# Patient Record
Sex: Male | Born: 1980 | Marital: Single | Smoking: Never smoker
Health system: Southern US, Community
[De-identification: ages and names within clinical notes are randomized; demographics above are authoritative.]

## PROBLEM LIST (undated history)

## (undated) DIAGNOSIS — K649 Unspecified hemorrhoids: Secondary | ICD-10-CM

## (undated) DIAGNOSIS — F79 Unspecified intellectual disabilities: Secondary | ICD-10-CM

## (undated) DIAGNOSIS — Z87898 Personal history of other specified conditions: Secondary | ICD-10-CM

## (undated) HISTORY — PX: TUMOR REMOVAL: SHX12

## (undated) HISTORY — DX: Personal history of other specified conditions: Z87.898

## (undated) HISTORY — DX: Unspecified hemorrhoids: K64.9

## (undated) HISTORY — DX: Unspecified intellectual disabilities: F79

---

## 2017-03-16 ENCOUNTER — Ambulatory Visit (INDEPENDENT_AMBULATORY_CARE_PROVIDER_SITE_OTHER): Payer: Medicare Other | Admitting: Family Medicine

## 2017-03-16 ENCOUNTER — Encounter: Payer: Self-pay | Admitting: Family Medicine

## 2017-03-16 ENCOUNTER — Encounter (INDEPENDENT_AMBULATORY_CARE_PROVIDER_SITE_OTHER): Payer: Self-pay

## 2017-03-16 VITALS — BP 124/86 | HR 76 | Temp 98.9°F | Ht 71.0 in | Wt 128.0 lb

## 2017-03-16 DIAGNOSIS — R634 Abnormal weight loss: Secondary | ICD-10-CM

## 2017-03-16 LAB — CBC WITH DIFFERENTIAL/PLATELET
Basophils Absolute: 0 10*3/uL (ref 0.0–0.1)
Basophils Relative: 1.1 % (ref 0.0–3.0)
Eosinophils Absolute: 0 10*3/uL (ref 0.0–0.7)
Eosinophils Relative: 1 % (ref 0.0–5.0)
HEMATOCRIT: 46.2 % (ref 39.0–52.0)
Hemoglobin: 15.4 g/dL (ref 13.0–17.0)
LYMPHS PCT: 36 % (ref 12.0–46.0)
Lymphs Abs: 1.1 10*3/uL (ref 0.7–4.0)
MCHC: 33.4 g/dL (ref 30.0–36.0)
MCV: 87.7 fl (ref 78.0–100.0)
MONOS PCT: 9.3 % (ref 3.0–12.0)
Monocytes Absolute: 0.3 10*3/uL (ref 0.1–1.0)
Neutro Abs: 1.6 10*3/uL (ref 1.4–7.7)
Neutrophils Relative %: 52.6 % (ref 43.0–77.0)
Platelets: 278 10*3/uL (ref 150.0–400.0)
RBC: 5.26 Mil/uL (ref 4.22–5.81)
RDW: 13.9 % (ref 11.5–15.5)
WBC: 3 10*3/uL — AB (ref 4.0–10.5)

## 2017-03-16 LAB — TSH: TSH: 0.68 u[IU]/mL (ref 0.35–4.50)

## 2017-03-16 LAB — T3, FREE: T3 FREE: 3.4 pg/mL (ref 2.3–4.2)

## 2017-03-16 LAB — T4, FREE: FREE T4: 0.93 ng/dL (ref 0.60–1.60)

## 2017-03-16 LAB — PSA: PSA: 0.81 ng/mL (ref 0.10–4.00)

## 2017-03-16 NOTE — Patient Instructions (Signed)
We will call with the results and go from there.  Take care  Dr. Bradi Arbuthnot  

## 2017-03-16 NOTE — Progress Notes (Signed)
Subjective:  Patient ID: Andrew Berger, male    DOB: Aug 24, 1981  Age: 36 y.o. MRN: [id number]  CC: Weight loss  HPI Andrew Berger is a 36 y.o. male presents to the clinic today for evaluation of the above.  Patient is mentally challenged, therefore the history is obtained primarily from the mother.  Mother notes that he has lost 20 pounds in the past month. Unintentional weight loss. She states that he has normal appetite. He has had recent abdominal pain and rectal bleeding. He was seen at a local urgent care and was treated for hemorrhoids. He was also seen in [facility] after developing abdominal pain in addition to his rectal bleeding. In the ER he had a negative CT of the abdomen. Labs revealed leukopenia and a suppressed TSH. Normal free T4. He was discharged home and instructed to follow-up with primary care.  Patient's weight in the ER was 129 pounds. He weighs 128 pounds today. Mother states that he continues to eat well. She does note that he has some difficulty with urinary frequency. She states that while being seen at urgent care and the emergency room he had an examination the prostate which was found to be firm and nodular. No PSA is available. He does have a family history of prostate cancer. His rectal bleeding has resolved. He's had no further abdominal pain, diarrhea. She states that he deals with constipation intermittently. Has improved with stool softener. Patient essentially has no complaints today other than ongoing weight loss. No reports of fevers, night sweats. Mother does state that he has some dark lesions on his legs which are concerning for her.   PMH, Surgical Hx, Family Hx, Social History reviewed and updated as below.  Past Medical History:  Diagnosis Date  . Hemorrhoids   . History of seizure   . Mental retardation    Past Surgical History:  Procedure Laterality Date  . TUMOR REMOVAL     ? Meningioma   Family History  Problem  Relation Age of Onset  . Hypertension Mother   . Diabetes Mother   . Colon cancer Father   . Prostate cancer Father   . Hyperlipidemia Father   . Hypertension Father   . Kidney disease Father   . Stroke Maternal Grandmother   . Prostate cancer Paternal Grandfather   . Hypertension Paternal Grandfather    Social History  Substance Use Topics  . Smoking status: Never Smoker  . Smokeless tobacco: Never Used  . Alcohol use Not on file    Review of Systems  Constitutional: Positive for unexpected weight change.  Gastrointestinal: Positive for abdominal pain, constipation and diarrhea.  Genitourinary: Positive for difficulty urinating and frequency.  Skin:       Hair loss, scalp itching  All other systems reviewed and are negative.   Objective:   Today's Vitals: BP 124/86 (BP Location: Right Arm, Patient Position: Sitting, Cuff Size: Normal)   Pulse 76   Temp 98.9 F (37.2 C) (Oral)   Ht 5\' 11"  (1.803 m)   Wt 128 lb (58.1 kg)   SpO2 96%   BMI 17.85 kg/m   Physical Exam  Constitutional: He is oriented to person, place, and time.  Thin male, well appearing, NAD.  HENT:  Head: Normocephalic and atraumatic.  Mouth/Throat: Oropharynx is clear and moist.  Eyes: Conjunctivae are normal. No scleral icterus.  Neck: Neck supple. No thyromegaly present.  Scattered lymphadenopathy.  Cardiovascular: Normal rate and regular rhythm.  No murmur heard. Pulmonary/Chest: Effort normal. He has no wheezes. He has no rales.  Abdominal: Soft. He exhibits no distension. There is no tenderness. There is no rebound and no guarding.  Musculoskeletal: Normal range of motion.  Neurological: He is alert and oriented to person, place, and time.  Skin:  Lower extremities with scattered hyperpigmented patches  Psychiatric: He has a normal mood and affect.  Vitals reviewed.  Assessment & Plan:   Problem List Items Addressed This Visit      Other   Unintentional weight loss - Primary    New  problem. Uncertain etiology/prognosis at this time. Additional laboratory studies obtained today. Normal thyroid studies. PSA normal. Leukopenia with normal differential. Neck with cervical lymphadenopathy. Needs Colonoscopy and possibly CT for evaluation of lymphadenopathy. Will discuss with patient and mother (I was waiting on laboratory studies and these returned after the visit).      Relevant Orders   CBC w/Diff (Completed)   TSH (Completed)   T4, free (Completed)   T3, free (Completed)   PSA (Completed)      Meds ordered this encounter  Medications  . shark liver oil-cocoa butter (PREPARATION H) 0.25-88.44 % suppository    Sig: Place 1 suppository rectally as needed for hemorrhoids.  Marland Kitchen docusate sodium (COLACE) 100 MG capsule    Sig: Take 100 mg by mouth 2 (two) times daily.  . Multiple Vitamins-Minerals (MULTIVITAMIN ADULT EXTRA C PO)    Sig: Take by mouth daily.   Follow-up: Pending discussion with mother/patient  Everlene Other DO [facility]

## 2017-03-16 NOTE — Assessment & Plan Note (Signed)
New problem. Uncertain etiology/prognosis at this time. Additional laboratory studies obtained today. Normal thyroid studies. PSA normal. Leukopenia with normal differential. Neck with cervical lymphadenopathy. Needs Colonoscopy and possibly CT for evaluation of lymphadenopathy. Will discuss with patient and mother (I was waiting on laboratory studies and these returned after the visit).

## 2017-03-19 ENCOUNTER — Other Ambulatory Visit: Payer: Self-pay | Admitting: Family Medicine

## 2017-03-19 DIAGNOSIS — R591 Generalized enlarged lymph nodes: Secondary | ICD-10-CM

## 2017-03-19 DIAGNOSIS — R634 Abnormal weight loss: Secondary | ICD-10-CM

## 2017-03-26 ENCOUNTER — Ambulatory Visit
Admission: RE | Admit: 2017-03-26 | Discharge: 2017-03-26 | Disposition: A | Discharge status: Home / Self Care | Payer: Medicare Other | Source: Ambulatory Visit | Attending: Family Medicine | Admitting: Family Medicine

## 2017-03-26 DIAGNOSIS — R634 Abnormal weight loss: Secondary | ICD-10-CM | POA: Diagnosis present

## 2017-03-26 DIAGNOSIS — R591 Generalized enlarged lymph nodes: Secondary | ICD-10-CM | POA: Diagnosis present

## 2017-03-26 MED ORDER — IOPAMIDOL (ISOVUE-300) INJECTION 61%
75.0000 mL | Freq: Once | INTRAVENOUS | Status: AC | PRN
  Administered 2017-03-26: 75 mL via INTRAVENOUS

## 2017-03-30 ENCOUNTER — Encounter: Payer: Self-pay | Admitting: *Deleted

## 2017-04-26 ENCOUNTER — Encounter: Payer: Self-pay | Admitting: Gastroenterology

## 2017-04-26 ENCOUNTER — Telehealth: Payer: Self-pay

## 2017-04-26 ENCOUNTER — Other Ambulatory Visit: Payer: Self-pay

## 2017-04-26 ENCOUNTER — Ambulatory Visit (INDEPENDENT_AMBULATORY_CARE_PROVIDER_SITE_OTHER): Payer: Medicare Other | Admitting: Gastroenterology

## 2017-04-26 VITALS — BP 132/93 | HR 62 | Temp 98.1°F | Ht 71.0 in | Wt 133.4 lb

## 2017-04-26 DIAGNOSIS — R634 Abnormal weight loss: Secondary | ICD-10-CM

## 2017-04-26 DIAGNOSIS — L659 Nonscarring hair loss, unspecified: Secondary | ICD-10-CM | POA: Diagnosis not present

## 2017-04-26 DIAGNOSIS — R631 Polydipsia: Secondary | ICD-10-CM

## 2017-04-26 DIAGNOSIS — Z800 Family history of malignant neoplasm of digestive organs: Secondary | ICD-10-CM

## 2017-04-26 NOTE — Patient Instructions (Addendum)
1. We will perform upper endoscopy and colonoscopy  2. Further tests based on above test results 3. Check HbA1C, random cortisol   Please call our office to speak with my nurse Iva LentoMichelle Ortega at [phone] during business hours from 8am to 4pm if you have any questions/concerns. During after hours, you will be redirected to on call GI physician. For any emergency please call 911 or go the nearest emergency room.    Arlyss Repressohini R Merrell Rettinger, MD [address]  [address]  [place], [place] [address]  Main: [phone]  Fax: [phone]

## 2017-04-26 NOTE — Telephone Encounter (Signed)
Gastroenterology Pre-Procedure Review  Request Date: 05/04/17 Requesting Physician: Dr. Allegra LaiVanga  PATIENT REVIEW QUESTIONS: The patient responded to the following health history questions as indicated:    0. Are you being treated for any acute or chronic illnesses?  NO 1. Are you having any GI issues? yes (weight loss) 2. Do you have a personal history of Polyps? no 3. Do you have a family history of Colon Cancer or Polyps? yes (mom and dad polyps, dad colon cancer) 4. Diabetes Mellitus? no 5. Joint replacements in the past 12 months?no 6. Major health problems in the past 3 months?no 7. Any artificial heart valves, MVP, or defibrillator?no    MEDICATIONS & ALLERGIES:    Patient reports the following regarding taking any anticoagulation/antiplatelet therapy:   Plavix, Coumadin, Eliquis, Xarelto, Lovenox, Pradaxa, Brilinta, or Effient? no Aspirin? no  Patient confirms/reports the following medications:  Current Outpatient Prescriptions  Medication Sig Dispense Refill  . docusate sodium (COLACE) 100 MG capsule Take 100 mg by mouth 2 (two) times daily.    . Multiple Vitamin (MULTI-VITAMINS) TABS Take 1 tablet by mouth daily.    . Multiple Vitamins-Minerals (MULTIVITAMIN ADULT EXTRA C PO) Take by mouth daily.    . shark liver oil-cocoa butter (PREPARATION H) 0.25-88.44 % suppository Place 1 suppository rectally as needed for hemorrhoids.     No current facility-administered medications for this visit.     Patient confirms/reports the following allergies:  Allergies no known allergies  No orders of the defined types were placed in this encounter.   AUTHORIZATION INFORMATION Primary Insurance: 1D#: Group #:  Secondary Insurance: 1D#: Group #:  SCHEDULE INFORMATION: Date: 05/04/17 Time: Location:[facility]

## 2017-04-26 NOTE — Progress Notes (Signed)
Andrew Darby, MD [address]  [place], [place] [address]  Main: [phone]  Fax: [phone]    Gastroenterology Consultation  Referring Provider:     Coral Spikes, DO Primary Care Physician:  Andrew Spikes, DO Primary Gastroenterologist:  Andrew Berger Reason for Consultation:     Unintentional weight loss        HPI:   Andrew Berger is a 36 y.o. y/o male referred for consultation & management  by Andrew Berger, Andrew Del, DO. Andrew Berger is accompanied by Andrew Berger today. He is concerned about 14 pound weight loss since May 2018 despite good appetite. He went to ER at [facility] in June 2018 after experiencing 3 days of diffuse abdominal pain associated with diarrhea and rectal bleeding. CT A/P in the ER was unremarkable. CBC, BMP, LFTs, TSH, HIV all came back negative. I'll write panel is normal. He was discharged home on Preparation H, suppository and Andrew symptoms resolved. He is recently seen by Andrew Berger who ordered CT neck soft tissues and it came back unremarkable. He continues to lose weight despite having 3 meals a day as per Andrew Berger. He is also losing hair and noticing small red bumps on Andrew legs and scalp which leave behind hyperpigmented spots after the heal. He reports that the small bumps are not painful. He denies fever, chills, older lesions, abdominal pain, upper GI symptoms, nausea, vomiting, loss of appetite. He does report having string-like bowel movement which is formed, but not bloody. He also reports dryness of mouth, increased thirst, frequent urination. Andrew PSA is normal. Andrew random blood glucose is normal.  He denies smoking, alcohol, NSAIDs, IV drugs, herbal supplements. He denies recent travel, sick contacts, use of antibiotics. Andrew father died from colon cancer at age 52.  Past Medical History:  Diagnosis Date  . Hemorrhoids   . History of seizure   . Mental retardation     Past Surgical History:  Procedure Laterality Date  . TUMOR REMOVAL       ? Meningioma    Prior to Admission medications   Medication Sig Start Date End Date Taking? Authorizing Provider  shark liver oil-cocoa butter (PREPARATION H) 0.25-88.44 % suppository Place 1 suppository rectally as needed for hemorrhoids.   Yes [provider]  docusate sodium (COLACE) 100 MG capsule Take 100 mg by mouth 2 (two) times daily.    [provider]  Multiple Vitamin (MULTI-VITAMINS) TABS Take 1 tablet by mouth daily.    [provider]  Multiple Vitamins-Minerals (MULTIVITAMIN ADULT EXTRA C PO) Take by mouth daily.    [provider]    Family History  Problem Relation Age of Onset  . Hypertension Mother   . Diabetes Mother   . Colon cancer Father   . Prostate cancer Father   . Hyperlipidemia Father   . Hypertension Father   . Kidney disease Father   . Stroke Maternal Grandmother   . Prostate cancer Paternal Grandfather   . Hypertension Paternal Grandfather      Social History  Substance Use Topics  . Smoking status: Never Smoker  . Smokeless tobacco: Never Used  . Alcohol use No    Allergies as of 04/26/2017  . (No Known Allergies)    Review of Systems:    All systems reviewed and negative except where noted in HPI.   Physical Exam:  BP (!) 132/93   Pulse 62   Temp 98.1 F (36.7  C) (Oral)   Ht _0  (1.803 m)   Wt 60.5 kg (133 lb 6.4 oz)   BMI 18.61 kg/m  No LMP for male patient. Psych:  Alert and cooperative. Normal mood and affect, thin built. General:   Alert,  Well-developed, well-nourished, pleasant and cooperative in NAD Head:  Normocephalic and atraumatic, bitemporal wasting, papular lesions on scalp Eyes:  Sclera clear, no icterus.   Conjunctiva pink. Ears:  Normal auditory acuity. Nose:  No deformity, discharge, or lesions. Mouth:  No deformity or lesions,oropharynx pink & moist. Neck:  Supple; no masses or thyromegaly. Lungs:  Respirations even and unlabored.  Clear throughout to auscultation.    No wheezes, crackles, or rhonchi. No acute distress. Heart:  Regular rate and rhythm; no murmurs, clicks, rubs, or gallops. Abdomen:  Normal bowel sounds.  No bruits.  Soft, non-tender and non-distended without masses, hepatosplenomegaly or hernias noted.  No guarding or rebound tenderness.    Msk:  Symmetrical without gross deformities. Good, equal movement & strength bilaterally. Pulses:  Normal pulses noted. Extremities:  No clubbing or edema.  No cyanosis. Neurologic:  Alert and oriented x3;  grossly normal neurologically. Skin:  Intact, patchy hyperpigmented spots on bilateral lower extremities. No jaundice. Lymph Nodes:  No significant cervical adenopathy. Psych:  Alert and cooperative. Normal mood and affect.  Imaging Studies: No results found.  Assessment and Plan:   [name] is a 36 y.o. y/o [place] American male with 3 month history of unintentional weight loss and transient episode of diffuse abdominal pain, diarrhea, rectal bleeding. The symptoms resolved spontaneously. Given that he has continued weight loss despite good appetite, and family history of colon cancer, I will perform upper endoscopy and a colonoscopy to rule out malignancy, inflammatory bowel disease. I will also perform blood work which include LFTs, ESR, CRP, vitamin D level, zinc, B12, ferritin, serum cortisol, hemoglobin A1c. I will perform further testing and/or imaging based on above workup and he may need referral to oncology.  Follow up in 4 weeks   Andrew Darby, MD

## 2017-04-28 ENCOUNTER — Other Ambulatory Visit: Payer: Self-pay | Admitting: Gastroenterology

## 2017-04-28 ENCOUNTER — Other Ambulatory Visit: Payer: Self-pay

## 2017-04-28 DIAGNOSIS — L659 Nonscarring hair loss, unspecified: Secondary | ICD-10-CM

## 2017-04-28 DIAGNOSIS — Z800 Family history of malignant neoplasm of digestive organs: Secondary | ICD-10-CM

## 2017-04-28 DIAGNOSIS — K625 Hemorrhage of anus and rectum: Secondary | ICD-10-CM

## 2017-04-28 DIAGNOSIS — R197 Diarrhea, unspecified: Secondary | ICD-10-CM

## 2017-05-03 ENCOUNTER — Encounter: Payer: Self-pay | Admitting: Gastroenterology

## 2017-05-04 ENCOUNTER — Ambulatory Visit: Payer: Medicare Other | Admitting: Anesthesiology

## 2017-05-04 ENCOUNTER — Encounter: Payer: Self-pay | Admitting: *Deleted

## 2017-05-04 ENCOUNTER — Encounter
Admission: RE | Disposition: A | Discharge status: Home / Self Care | Payer: Self-pay | Source: Ambulatory Visit | Attending: Gastroenterology

## 2017-05-04 ENCOUNTER — Ambulatory Visit
Admission: RE | Admit: 2017-05-04 | Discharge: 2017-05-04 | Disposition: A | Discharge status: Home / Self Care | Payer: Medicare Other | Source: Ambulatory Visit | Attending: Gastroenterology | Admitting: Gastroenterology

## 2017-05-04 DIAGNOSIS — Z823 Family history of stroke: Secondary | ICD-10-CM | POA: Insufficient documentation

## 2017-05-04 DIAGNOSIS — K644 Residual hemorrhoidal skin tags: Secondary | ICD-10-CM | POA: Insufficient documentation

## 2017-05-04 DIAGNOSIS — Z833 Family history of diabetes mellitus: Secondary | ICD-10-CM | POA: Insufficient documentation

## 2017-05-04 DIAGNOSIS — Z800 Family history of malignant neoplasm of digestive organs: Secondary | ICD-10-CM | POA: Diagnosis not present

## 2017-05-04 DIAGNOSIS — K625 Hemorrhage of anus and rectum: Secondary | ICD-10-CM | POA: Insufficient documentation

## 2017-05-04 DIAGNOSIS — Z79899 Other long term (current) drug therapy: Secondary | ICD-10-CM | POA: Diagnosis not present

## 2017-05-04 DIAGNOSIS — F79 Unspecified intellectual disabilities: Secondary | ICD-10-CM | POA: Insufficient documentation

## 2017-05-04 DIAGNOSIS — Z681 Body mass index (BMI) 19 or less, adult: Secondary | ICD-10-CM | POA: Diagnosis not present

## 2017-05-04 DIAGNOSIS — R634 Abnormal weight loss: Secondary | ICD-10-CM | POA: Diagnosis not present

## 2017-05-04 DIAGNOSIS — Z8249 Family history of ischemic heart disease and other diseases of the circulatory system: Secondary | ICD-10-CM | POA: Diagnosis not present

## 2017-05-04 DIAGNOSIS — Z8042 Family history of malignant neoplasm of prostate: Secondary | ICD-10-CM | POA: Diagnosis not present

## 2017-05-04 DIAGNOSIS — R569 Unspecified convulsions: Secondary | ICD-10-CM | POA: Insufficient documentation

## 2017-05-04 HISTORY — PX: ESOPHAGOGASTRODUODENOSCOPY (EGD) WITH PROPOFOL: SHX5813

## 2017-05-04 HISTORY — PX: COLONOSCOPY WITH PROPOFOL: SHX5780

## 2017-05-04 SURGERY — COLONOSCOPY WITH PROPOFOL
Anesthesia: General

## 2017-05-04 MED ORDER — SODIUM CHLORIDE 0.9 % IV SOLN: INTRAVENOUS | Status: DC

## 2017-05-04 MED ORDER — GLYCOPYRROLATE 0.2 MG/ML IJ SOLN
INTRAMUSCULAR | Status: DC | PRN
  Administered 2017-05-04: 0.2 mg via INTRAVENOUS

## 2017-05-04 MED ORDER — SODIUM CHLORIDE 0.9 % IV SOLN
INTRAVENOUS | Status: DC
  Administered 2017-05-04: 11:00:00 via INTRAVENOUS

## 2017-05-04 MED ORDER — PROPOFOL 500 MG/50ML IV EMUL
INTRAVENOUS | Status: DC | PRN
  Administered 2017-05-04: 150 ug/kg/min via INTRAVENOUS

## 2017-05-04 MED ORDER — LIDOCAINE HCL (PF) 2 % IJ SOLN
INTRAMUSCULAR | Status: AC
  Filled 2017-05-04: qty 2

## 2017-05-04 MED ORDER — PROPOFOL 500 MG/50ML IV EMUL
INTRAVENOUS | Status: AC
  Filled 2017-05-04: qty 50

## 2017-05-04 MED ORDER — GLYCOPYRROLATE 0.2 MG/ML IJ SOLN
INTRAMUSCULAR | Status: AC
  Filled 2017-05-04: qty 1

## 2017-05-04 MED ORDER — LIDOCAINE HCL (CARDIAC) 20 MG/ML IV SOLN
INTRAVENOUS | Status: DC | PRN
  Administered 2017-05-04: 100 mg via INTRAVENOUS

## 2017-05-04 MED ORDER — PROPOFOL 10 MG/ML IV BOLUS
INTRAVENOUS | Status: DC | PRN
  Administered 2017-05-04: 70 mg via INTRAVENOUS
  Administered 2017-05-04 (×2): 40 mg via INTRAVENOUS

## 2017-05-04 NOTE — H&P (Signed)
  Arlyss Repress, MD [address]  [address]  [place], [place] [address]  Main: [phone]  Fax: [phone] Pager: [phone]  Primary Care Physician:  Tommie Sams, DO Primary Gastroenterologist:  Dr. Arlyss Repress  Pre-Procedure History & Physical: HPI:  Andrew Berger is a 36 y.o. male is here for an endoscopy and colonoscopy.   Past Medical History:  Diagnosis Date  . Hemorrhoids   . History of seizure   . Mental retardation     Past Surgical History:  Procedure Laterality Date  . TUMOR REMOVAL     ? Meningioma    Prior to Admission medications   Medication Sig Start Date End Date Taking? Authorizing Provider  docusate sodium (COLACE) 100 MG capsule Take 100 mg by mouth 2 (two) times daily.   Yes [provider]  Multiple Vitamin (MULTI-VITAMINS) TABS Take 1 tablet by mouth daily.   Yes [provider]  Multiple Vitamins-Minerals (MULTIVITAMIN ADULT EXTRA C PO) Take by mouth daily.   Yes [provider]  shark liver oil-cocoa butter (PREPARATION H) 0.25-88.44 % suppository Place 1 suppository rectally as needed for hemorrhoids.   Yes [provider]    Allergies as of 04/26/2017  . (No Known Allergies)    Family History  Problem Relation Age of Onset  . Hypertension Mother   . Diabetes Mother   . Colon cancer Father   . Prostate cancer Father   . Hyperlipidemia Father   . Hypertension Father   . Kidney disease Father   . Stroke Maternal Grandmother   . Prostate cancer Paternal Grandfather   . Hypertension Paternal Grandfather     Social History   Social History  . Marital status: Single    Spouse name: N/A  . Number of children: N/A  . Years of education: N/A   Occupational History  . Not on file.   Social History Main Topics  . Smoking status: Never Smoker  . Smokeless tobacco: Never Used  . Alcohol use No  . Drug use: No  . Sexual activity: Yes   Other Topics Concern  . Not on file    Social History Narrative  . No narrative on file    Review of Systems: See HPI, otherwise negative ROS  Physical Exam: BP 127/79   Pulse 71   Temp (!) 97.5 F (36.4 C) (Tympanic)   Resp 16   Ht 5\' 11"  (1.803 m)   Wt 58.5 kg (129 lb)   SpO2 100%   BMI 17.99 kg/m  General:   Alert,  pleasant and cooperative in NAD Head:  Normocephalic and atraumatic. Neck:  Supple; no masses or thyromegaly. Lungs:  Clear throughout to auscultation.    Heart:  Regular rate and rhythm. Abdomen:  Soft, nontender and nondistended. Normal bowel sounds, without guarding, and without rebound.   Neurologic:  Alert and  oriented x4;  grossly normal neurologically.  Impression/Plan: Jeanice Lim is here for an endoscopy and colonoscopy to be performed for unexplained weight loss, diarrhea, rectal bleeding  Risks, benefits, limitations, and alternatives regarding  endoscopy and colonoscopy have been reviewed with the patient.  Questions have been answered.  All parties agreeable.   Lannette Donath, MD  05/04/2017, 10:22 AM

## 2017-05-04 NOTE — Op Note (Signed)
[facility] Patient Name: Andrew Berger Procedure Date: 05/04/2017 10:49 AM MRN: [id number] Account #: [id number] Date of Birth: 1981/01/07 Admit Type: Outpatient Age: 36 Room: [facility] ENDO ROOM 1 Gender: Male Note Status: Finalized Procedure:            Colonoscopy Indications:          Rectal bleeding, Weight loss Providers:            Lin Landsman MD, MD Referring MD:         Barnie Del. Lacinda Axon MD, MD (Referring MD) Medicines:            Monitored Anesthesia Care Complications:        No immediate complications. Estimated blood loss: None. Procedure:            Pre-Anesthesia Assessment:                       - Prior to the procedure, a History and Physical was                        performed, and patient medications and allergies were                        reviewed. The patient is competent. The risks and                        benefits of the procedure and the sedation options and                        risks were discussed with the patient. All questions                        were answered and informed consent was obtained.                        Patient identification and proposed procedure were                        verified by the physician, the nurse, the                        anesthesiologist, the anesthetist and the technician in                        the pre-procedure area in the procedure room. Mental                        Status Examination: alert and oriented. Airway                        Examination: normal oropharyngeal airway and neck                        mobility. Respiratory Examination: clear to                        auscultation. CV Examination: normal. Prophylactic                        Antibiotics: The patient does not require prophylactic  antibiotics. Prior Anticoagulants: The patient has                        taken no previous anticoagulant or antiplatelet agents.   ASA Grade Assessment: I - A normal, healthy patient.                        After reviewing the risks and benefits, the patient was                        deemed in satisfactory condition to undergo the                        procedure. The anesthesia plan was to use monitored                        anesthesia care (MAC). Immediately prior to                        administration of medications, the patient was                        re-assessed for adequacy to receive sedatives. The                        heart rate, respiratory rate, oxygen saturations, blood                        pressure, adequacy of pulmonary ventilation, and                        response to care were monitored throughout the                        procedure. The physical status of the patient was                        re-assessed after the procedure.                       After obtaining informed consent, the colonoscope was                        passed under direct vision. Throughout the procedure,                        the patient's blood pressure, pulse, and oxygen                        saturations were monitored continuously. The                        Colonoscope was introduced through the anus and                        advanced to the 5 cm into the ileum. The colonoscopy                        was performed without difficulty. The patient tolerated  the procedure well. The quality of the bowel                        preparation was evaluated using the BBPS [facility] Bowel                        Preparation Scale) with scores of: Right Colon = 3,                        Transverse Colon = 3 and Left Colon = 3 (entire mucosa                        seen well with no residual staining, small fragments of                        stool or opaque liquid). The total BBPS score equals 9. Findings:      The perianal exam findings include non-thrombosed external hemorrhoids.      The digital  rectal exam was normal. Pertinent negatives include normal       sphincter tone and no palpable rectal lesions.      The colon (entire examined portion) appeared normal. Biopsies were taken       with a cold forceps for histology.      The terminal ileum appeared normal.      Non-thrombosed external hemorrhoids were found during retroflexion. The       hemorrhoids were moderate. Impression:           - Non-thrombosed external hemorrhoids found on perianal                        exam.                       - The entire examined colon is normal. Biopsied.                       - The examined portion of the ileum was normal.                       - Non-thrombosed external hemorrhoids. Recommendation:       - Await pathology results.                       - Discharge patient to home.                       - Resume regular diet today.                       - Continue present medications. Procedure Code(s):    --- Professional ---                       [phone], Colonoscopy, flexible; with biopsy, single or                        multiple Diagnosis Code(s):    --- Professional ---                       K64.4, Residual hemorrhoidal skin tags  K62.5, Hemorrhage of anus and rectum                       R63.4, Abnormal weight loss CPT copyright 2016 American Medical Association. All rights reserved. The codes documented in this report are preliminary and upon coder review may  be revised to meet current compliance requirements. Dr. Ulyess Mort Lin Landsman MD, MD 05/04/2017 11:29:43 AM This report has been signed electronically. Number of Addenda: 0 Note Initiated On: 05/04/2017 10:49 AM Scope Withdrawal Time: 0 hours 8 minutes 48 seconds  Total Procedure Duration: 0 hours 15 minutes 35 seconds       [facility]

## 2017-05-04 NOTE — Op Note (Signed)
[facility] Gastroenterology Patient Name: Andrew Berger Procedure Date: 05/04/2017 10:50 AM MRN: [id number] Account #: [id number] Date of Birth: Nov 11, 1980 Admit Type: Outpatient Age: 36 Room: [facility] ENDO ROOM 1 Gender: Male Note Status: Finalized Procedure:            Upper GI endoscopy Indications:          Weight loss Providers:            Lin Landsman MD, MD Referring MD:         Barnie Del. Lacinda Axon MD, MD (Referring MD) Medicines:            Monitored Anesthesia Care Complications:        No immediate complications. Estimated blood loss: None. Procedure:            Pre-Anesthesia Assessment:                       - Prior to the procedure, a History and Physical was                        performed, and patient medications and allergies were                        reviewed. The patient is competent. The risks and                        benefits of the procedure and the sedation options and                        risks were discussed with the patient. All questions                        were answered and informed consent was obtained.                        Patient identification and proposed procedure were                        verified by the physician, the nurse, the                        anesthesiologist, the anesthetist and the technician in                        the pre-procedure area in the procedure room. Mental                        Status Examination: alert and oriented. Airway                        Examination: normal oropharyngeal airway and neck                        mobility. Respiratory Examination: clear to                        auscultation. CV Examination: normal. Prophylactic                        Antibiotics: The patient does not require prophylactic  antibiotics. Prior Anticoagulants: The patient has                        taken no previous anticoagulant or antiplatelet agents.                        ASA Grade  Assessment: I - A normal, healthy patient.                        After reviewing the risks and benefits, the patient was                        deemed in satisfactory condition to undergo the                        procedure. The anesthesia plan was to use monitored                        anesthesia care (MAC). Immediately prior to                        administration of medications, the patient was                        re-assessed for adequacy to receive sedatives. The                        heart rate, respiratory rate, oxygen saturations, blood                        pressure, adequacy of pulmonary ventilation, and                        response to care were monitored throughout the                        procedure. The physical status of the patient was                        re-assessed after the procedure.                       After obtaining informed consent, the endoscope was                        passed under direct vision. Throughout the procedure,                        the patient's blood pressure, pulse, and oxygen                        saturations were monitored continuously. The Endoscope                        was introduced through the mouth, and advanced to the                        third part of duodenum. The upper GI endoscopy was  accomplished without difficulty. The patient tolerated                        the procedure well. Findings:      The gastroesophageal junction and examined esophagus were normal.      The entire examined stomach was normal.      The duodenal bulb, second portion of the duodenum and third portion of       the duodenum were normal. Biopsies were taken with a cold forceps for       histology. Impression:           - Normal gastroesophageal junction and esophagus.                       - Normal stomach.                       - Normal duodenal bulb, second portion of the duodenum                        and third  portion of the duodenum. Biopsied. Recommendation:       - Await pathology results.                       - No repeat upper endoscopy.                       - Proceed with colonoscopy Procedure Code(s):    --- Professional ---                       [phone], Esophagogastroduodenoscopy, flexible, transoral;                        with biopsy, single or multiple Diagnosis Code(s):    --- Professional ---                       R63.4, Abnormal weight loss CPT copyright 2016 American Medical Association. All rights reserved. The codes documented in this report are preliminary and upon coder review may  be revised to meet current compliance requirements. Dr. Ulyess Mort Lin Landsman MD, MD 05/04/2017 11:26:25 AM This report has been signed electronically. Number of Addenda: 0 Note Initiated On: 05/04/2017 10:50 AM      [facility]

## 2017-05-04 NOTE — Anesthesia Preprocedure Evaluation (Signed)
Anesthesia Evaluation  Patient identified by MRN, date of birth, ID band Patient awake    Reviewed: Allergy & Precautions, H&P , NPO status , Patient's Chart, lab work & pertinent test results, reviewed documented beta blocker date and time   History of Anesthesia Complications Negative for: history of anesthetic complications  Airway Mallampati: I  TM Distance: >3 FB Neck ROM: full    Dental  (+) Dental Advidsory Given, Teeth Intact   Pulmonary neg pulmonary ROS,           Cardiovascular Exercise Tolerance: Good negative cardio ROS       Neuro/Psych Seizures - (none since age 19), Well Controlled,  PSYCHIATRIC DISORDERS    GI/Hepatic negative GI ROS, Neg liver ROS,   Endo/Other  negative endocrine ROS  Renal/GU negative Renal ROS  negative genitourinary   Musculoskeletal   Abdominal   Peds  Hematology negative hematology ROS (+)   Anesthesia Other Findings Past Medical History: No date: Hemorrhoids No date: History of seizure No date: Mental retardation   Reproductive/Obstetrics negative OB ROS                             Anesthesia Physical Anesthesia Plan  ASA: I  Anesthesia Plan: General   Post-op Pain Management:    Induction: Intravenous  PONV Risk Score and Plan: 2 and Propofol infusion  Airway Management Planned: Natural Airway and Nasal Cannula  Additional Equipment:   Intra-op Plan:   Post-operative Plan:   Informed Consent: I have reviewed the patients History and Physical, chart, labs and discussed the procedure including the risks, benefits and alternatives for the proposed anesthesia with the patient or authorized representative who has indicated his/her understanding and acceptance.   Dental Advisory Given  Plan Discussed with: Anesthesiologist, CRNA and Surgeon  Anesthesia Plan Comments:         Anesthesia Quick Evaluation

## 2017-05-04 NOTE — Anesthesia Postprocedure Evaluation (Signed)
Anesthesia Post Note  Patient: Andrew Berger  Procedure(s) Performed: Procedure(s) (LRB): COLONOSCOPY WITH PROPOFOL (N/A) ESOPHAGOGASTRODUODENOSCOPY (EGD) WITH PROPOFOL (N/A)  Patient location during evaluation: Endoscopy Anesthesia Type: General Level of consciousness: awake and alert Pain management: pain level controlled Vital Signs Assessment: post-procedure vital signs reviewed and stable Respiratory status: spontaneous breathing, nonlabored ventilation, respiratory function stable and patient connected to nasal cannula oxygen Cardiovascular status: blood pressure returned to baseline and stable Postop Assessment: no signs of nausea or vomiting Anesthetic complications: no     Last Vitals:  Vitals:   05/04/17 1130 05/04/17 1144  BP: 117/79 119/82  Pulse: 83 70  Resp: 19 17  Temp:    SpO2: 100% 100%    Last Pain:  Vitals:   05/04/17 1120  TempSrc: Tympanic                 Lenard Simmer

## 2017-05-04 NOTE — Transfer of Care (Signed)
Immediate Anesthesia Transfer of Care Note  Patient: Andrew Berger  Procedure(s) Performed: Procedure(s): COLONOSCOPY WITH PROPOFOL (N/A) ESOPHAGOGASTRODUODENOSCOPY (EGD) WITH PROPOFOL (N/A)  Patient Location: Endoscopy Unit  Anesthesia Type:General  Level of Consciousness: awake and sedated  Airway & Oxygen Therapy: Patient Spontanous Breathing and Patient connected to nasal cannula oxygen  Post-op Assessment: Report given to RN and Post -op Vital signs reviewed and stable  Post vital signs: Reviewed and stable  Last Vitals:  Vitals:   05/04/17 1012 05/04/17 1120  BP: 127/79 (!) 88/45  Pulse: 71 92  Resp: 16 16  Temp: (!) 36.4 C (!) 36.3 C  SpO2: 100% 100%    Last Pain:  Vitals:   05/04/17 1120  TempSrc: Tympanic         Complications: No apparent anesthesia complications

## 2017-05-04 NOTE — Anesthesia Post-op Follow-up Note (Signed)
Anesthesia QCDR form completed.        

## 2017-05-06 ENCOUNTER — Telehealth: Payer: Self-pay

## 2017-05-06 ENCOUNTER — Encounter: Payer: Self-pay | Admitting: Gastroenterology

## 2017-05-06 LAB — SURGICAL PATHOLOGY

## 2017-05-06 NOTE — Telephone Encounter (Signed)
Left patient a voice message letting patient know his labs were mostly normal however Vit D was mildly low.  Recommended vitamin D 1000 units daily OTC.

## 2017-05-07 ENCOUNTER — Encounter: Payer: Self-pay | Admitting: Gastroenterology

## 2017-05-07 LAB — QUANTIFERON IN TUBE
QFT TB AG MINUS NIL VALUE: <0 IU/mL
QUANTIFERON NIL VALUE: 0.03 [IU]/mL
QUANTIFERON TB AG VALUE: 0.02 [IU]/mL
QUANTIFERON TB GOLD: NEGATIVE

## 2017-05-07 LAB — QUANTIFERON TB GOLD ASSAY (BLOOD)

## 2017-05-10 ENCOUNTER — Other Ambulatory Visit: Payer: Self-pay

## 2017-05-10 LAB — FERRITIN: FERRITIN: 144 ng/mL (ref 30–400)

## 2017-05-10 LAB — HEPATIC FUNCTION PANEL
ALK PHOS: 49 IU/L (ref 39–117)
ALT: 17 IU/L (ref 0–44)
AST: 16 IU/L (ref 0–40)
Albumin: 4.9 g/dL (ref 3.5–5.5)
BILIRUBIN TOTAL: 0.7 mg/dL (ref 0.0–1.2)
BILIRUBIN, DIRECT: 0.18 mg/dL (ref 0.00–0.40)
Total Protein: 8 g/dL (ref 6.0–8.5)

## 2017-05-10 LAB — SEDIMENTATION RATE: Sed Rate: 2 mm/hr (ref 0–15)

## 2017-05-10 LAB — HIGH SENSITIVITY CRP: CRP, High Sensitivity: 0.47 mg/L (ref 0.00–3.00)

## 2017-05-10 LAB — ZINC

## 2017-05-10 LAB — VITAMIN B12: VITAMIN B 12: 792 pg/mL (ref 232–1245)

## 2017-05-10 LAB — HEMOGLOBIN A1C
Est. average glucose Bld gHb Est-mCnc: 108 mg/dL
HEMOGLOBIN A1C: 5.4 % (ref 4.8–5.6)

## 2017-05-10 LAB — VITAMIN D 25 HYDROXY (VIT D DEFICIENCY, FRACTURES): Vit D, 25-Hydroxy: 19.8 ng/mL — ABNORMAL LOW (ref 30.0–100.0)

## 2017-05-10 LAB — CORTISOL: Cortisol: 13.8 ug/dL

## 2017-05-25 ENCOUNTER — Encounter: Payer: Self-pay | Admitting: Otolaryngology

## 2017-05-25 ENCOUNTER — Ambulatory Visit: Payer: Medicare Other | Admitting: Gastroenterology

## 2018-01-11 ENCOUNTER — Telehealth: Payer: Self-pay

## 2018-01-11 NOTE — Telephone Encounter (Signed)
Copied from CRM [id number]. Topic: Appointment Scheduling - Scheduling Inquiry for Clinic >> Jan 11, 2018  9:40 AM Floria Raveling A wrote: Reason for CRM: mother called in and said that this pt was a pt of [employer].  She stated he is mentally challenged and pt is getting more and more aggressive.  She would like to know who she could get him transferred to or take him on as a new pt?  She is wanting him to be seen soon then later?  Please advise ?    Best number [phone]

## 2018-01-14 ENCOUNTER — Telehealth: Payer: Self-pay | Admitting: Internal Medicine

## 2018-01-14 NOTE — Telephone Encounter (Signed)
Left message to give the office a call to schedule her son with Dr. Shirlee Latch -Nickolas Madrid on 5.6.19 @ 10:00.

## 2018-01-14 NOTE — Telephone Encounter (Signed)
Left the patient's mother a message to give the office a call to schedule the patient with Dr. Judie Grieve on 5.6.19 @ 10:00.

## 2018-08-17 ENCOUNTER — Ambulatory Visit: Payer: Self-pay | Admitting: Family Medicine

## 2018-09-04 IMAGING — CT CT NECK W/ CM
2 of 3 series · 8 of 14 positions shown, 10 images · IV contrast (iopamidol)
Comparison: None.

CLINICAL DATA: Weight loss over the last 3 months. Intermittent
sore throat.

EXAM:
CT NECK WITH CONTRAST
TECHNIQUE: Multidetector CT imaging of the neck was performed using the
standard protocol following the bolus administration of intravenous
contrast.
CONTRAST:  75mL [id number] IOPAMIDOL ([id number]) INJECTION 61%

[Series 2: axial neck · axial · 0.60mm/px · z∈[-198,-68]mm · 3 of 131 slices shown]
[im 33/131  bone]
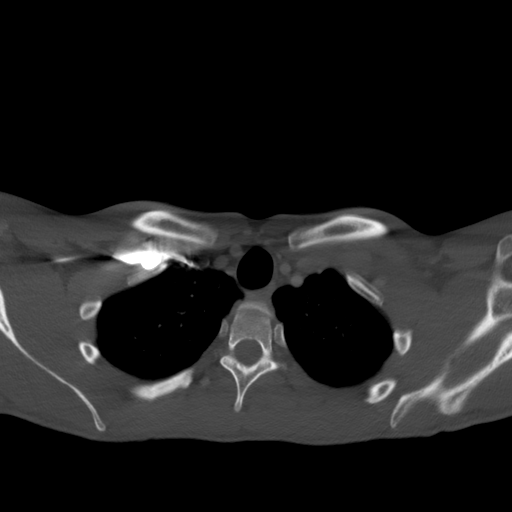
[im 66/131  bone]
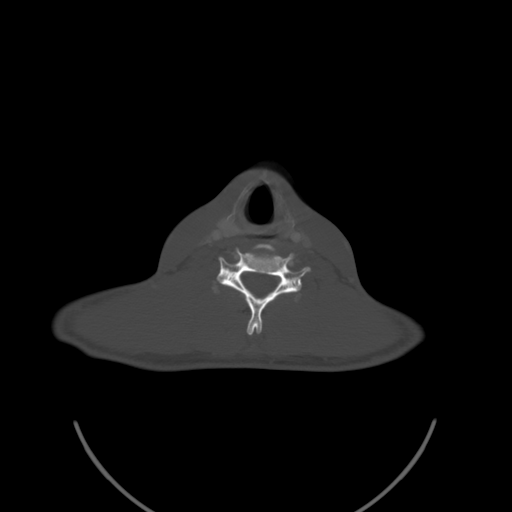
[im 98/131  bone]
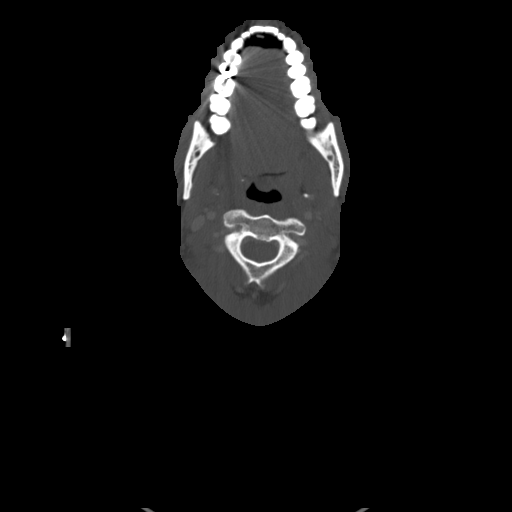

[Series 8: orthogonal ax · axial · 0.50mm/px · z∈[-255,-50]mm · 5 of 157 slices shown, 7 images]
[im 27/157  soft-tissue]
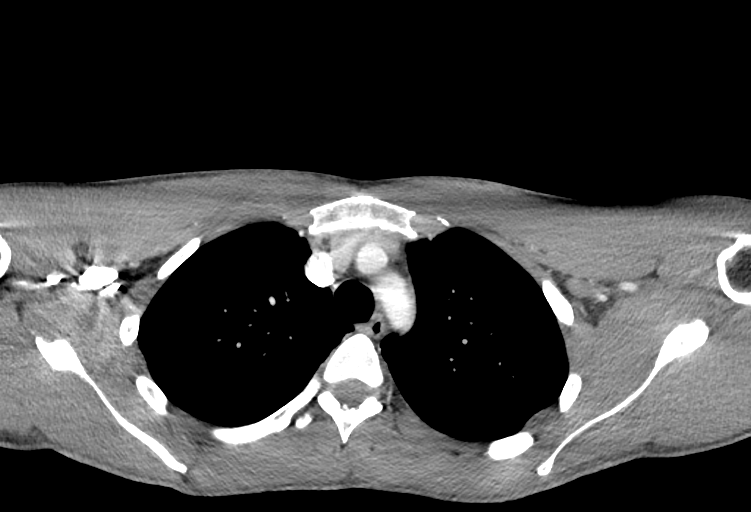
[im 27/157  bone]
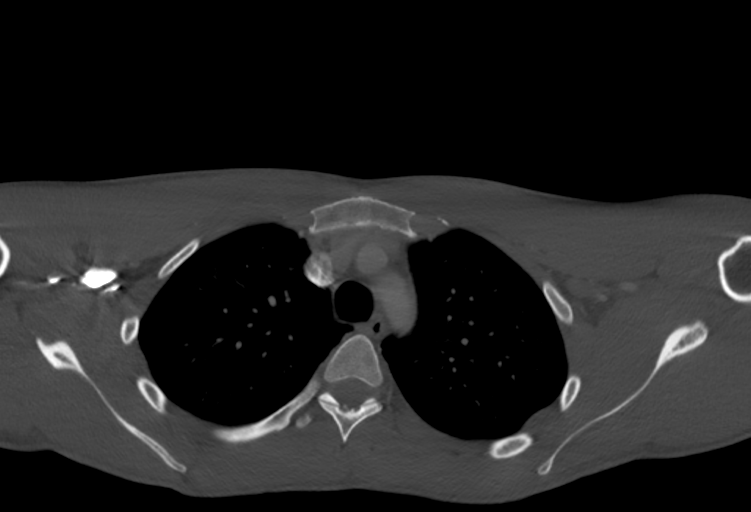
[im 53/157  bone]
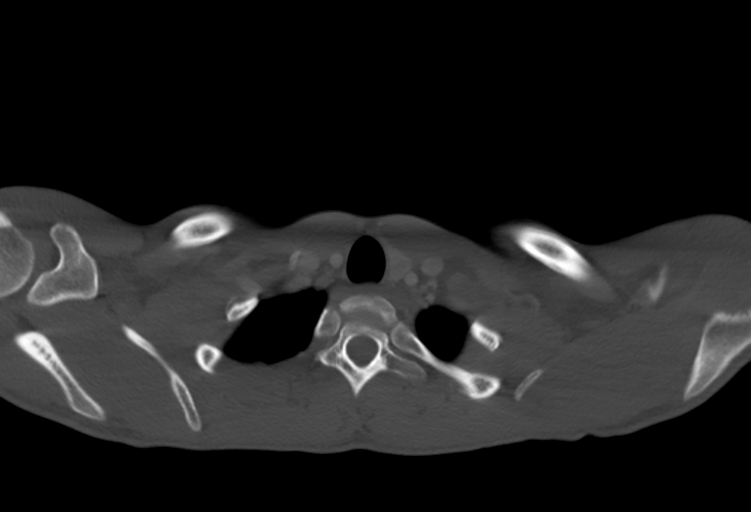
[im 79/157  bone]
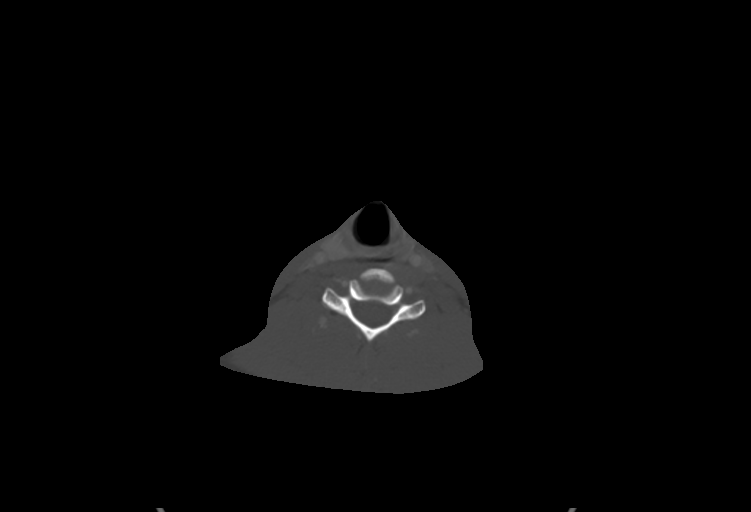
[im 105/157  bone]
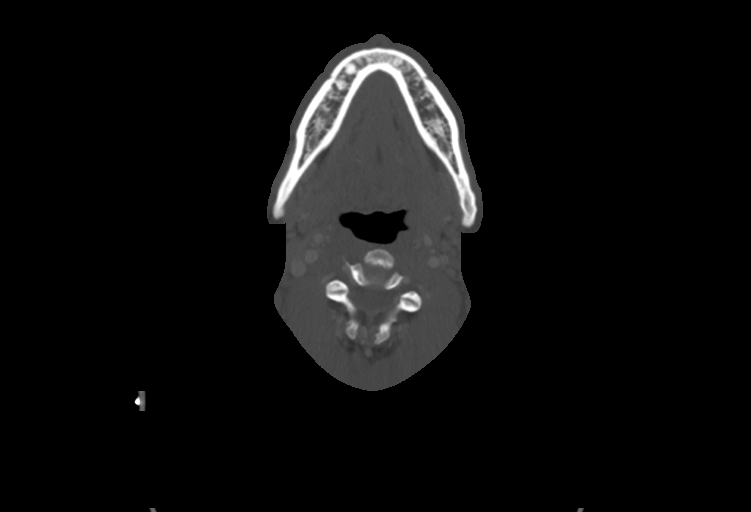
[im 131/157  soft-tissue]
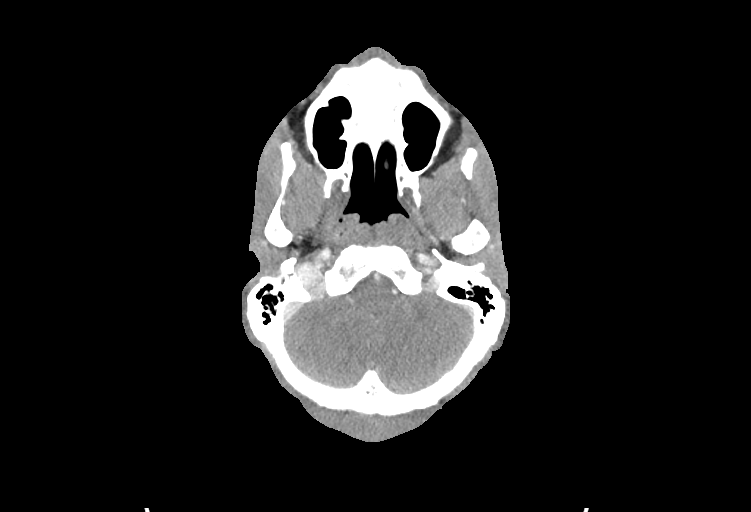
[im 131/157  bone]
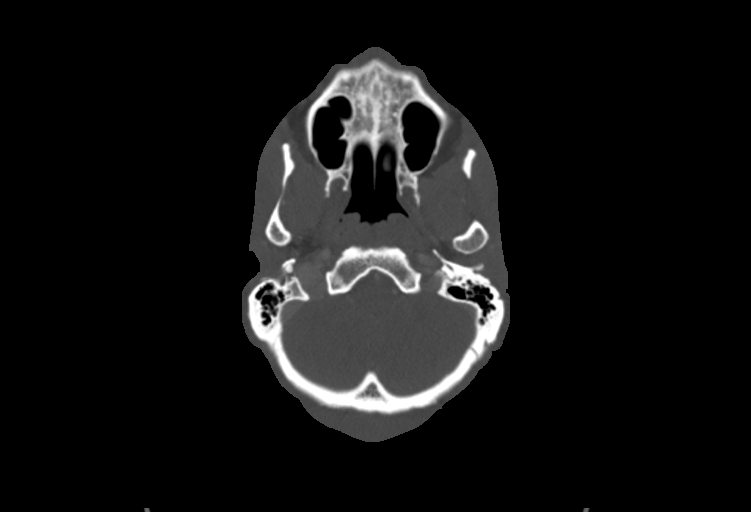

[8 of 14 positions shown; findings below may reference images not displayed]

FINDINGS: Pharynx and larynx: No mucosal or submucosal lesion.

Salivary glands: Parotid and submandibular glands are normal.

Thyroid: Normal

Lymph nodes: No enlarged or low-density nodes on either side of the
neck.

Vascular: Arterial and venous structures are patent. There is
atherosclerotic disease at both carotid bifurcations but no
stenosis.

Limited intracranial: Normal

Visualized orbits: Limited, normal

Mastoids and visualized paranasal sinuses: Clear

Skeleton: Normal

Upper chest: Normal

Other: Extreme paucity of fat.
IMPRESSION: No pathologic finding.  Extreme paucity of fat.

## 2018-11-01 ENCOUNTER — Telehealth: Payer: Self-pay

## 2018-11-01 NOTE — Telephone Encounter (Signed)
Pt appears on [facility] Quality Report for [facility] but has never been seen in the office.  I called the pt's mother and left a message asking her to call me at [phone].  VDM (DD)

## 2018-11-03 NOTE — Telephone Encounter (Signed)
2nd attempt - left message

## 2018-11-04 NOTE — Telephone Encounter (Signed)
3rd attempt left message
